# Patient Record
Sex: Male | Born: 1965 | Hispanic: Yes | Marital: Single
Health system: Southern US, Community
[De-identification: ages and names within clinical notes are randomized; demographics above are authoritative.]

---

## 2012-12-31 ENCOUNTER — Ambulatory Visit: Payer: Self-pay | Admitting: Family Medicine

## 2013-04-23 ENCOUNTER — Inpatient Hospital Stay: Payer: Self-pay | Admitting: Surgery

## 2013-04-23 LAB — CBC
HCT: 46.9 % (ref 40.0–52.0)
MCH: 31.1 pg (ref 26.0–34.0)
MCHC: 34.8 g/dL (ref 32.0–36.0)
MCV: 90 fL (ref 80–100)
RBC: 5.24 10*6/uL (ref 4.40–5.90)
RDW: 13.9 % (ref 11.5–14.5)
WBC: 7.1 10*3/uL (ref 3.8–10.6)

## 2013-04-23 LAB — COMPREHENSIVE METABOLIC PANEL
Albumin: 3.9 g/dL (ref 3.4–5.0)
Anion Gap: 3 — ABNORMAL LOW (ref 7–16)
Bilirubin,Total: 0.7 mg/dL (ref 0.2–1.0)
Co2: 27 mmol/L (ref 21–32)
Creatinine: 0.79 mg/dL (ref 0.60–1.30)
EGFR (African American): >60
EGFR (Non-African Amer.): >60
Glucose: 101 mg/dL — ABNORMAL HIGH (ref 65–99)
Osmolality: 272 (ref 275–301)
SGPT (ALT): 114 U/L — ABNORMAL HIGH (ref 12–78)
Sodium: 136 mmol/L (ref 136–145)
Total Protein: 8.2 g/dL (ref 6.4–8.2)

## 2013-04-23 LAB — CK TOTAL AND CKMB (NOT AT ARMC): CK, Total: 113 U/L (ref 35–232)

## 2013-04-23 LAB — LIPASE, BLOOD: Lipase: 138 U/L (ref 73–393)

## 2013-04-23 LAB — TROPONIN I: Troponin-I: <0.02 ng/mL

## 2013-04-24 LAB — CBC WITH DIFFERENTIAL/PLATELET
Basophil %: 0.1 %
Eosinophil #: 0 10*3/uL (ref 0.0–0.7)
Eosinophil %: 0 %
HGB: 15.5 g/dL (ref 13.0–18.0)
Lymphocyte #: 1.6 10*3/uL (ref 1.0–3.6)
Lymphocyte %: 13.8 %
MCH: 31.2 pg (ref 26.0–34.0)
MCV: 90 fL (ref 80–100)
Monocyte %: 4.1 %
Neutrophil #: 9.5 10*3/uL — ABNORMAL HIGH (ref 1.4–6.5)
Platelet: 252 10*3/uL (ref 150–440)
RDW: 14 % (ref 11.5–14.5)

## 2013-04-24 LAB — BASIC METABOLIC PANEL
Anion Gap: 5 — ABNORMAL LOW (ref 7–16)
BUN: 7 mg/dL (ref 7–18)
Calcium, Total: 8.7 mg/dL (ref 8.5–10.1)
Chloride: 104 mmol/L (ref 98–107)
Co2: 28 mmol/L (ref 21–32)
EGFR (African American): >60
Glucose: 116 mg/dL — ABNORMAL HIGH (ref 65–99)
Osmolality: 273 (ref 275–301)
Potassium: 3.6 mmol/L (ref 3.5–5.1)
Sodium: 137 mmol/L (ref 136–145)

## 2013-04-24 LAB — URINALYSIS, COMPLETE
Bacteria: NONE SEEN
Bilirubin,UR: NEGATIVE
Blood: NEGATIVE
Glucose,UR: NEGATIVE mg/dL (ref 0–75)
Ph: 6 (ref 4.5–8.0)

## 2013-04-25 LAB — CBC WITH DIFFERENTIAL/PLATELET
Basophil #: 0 10*3/uL (ref 0.0–0.1)
Basophil %: 0.1 %
Eosinophil %: 0.1 %
Lymphocyte #: 1.8 10*3/uL (ref 1.0–3.6)
Lymphocyte %: 19.9 %
MCHC: 35.1 g/dL (ref 32.0–36.0)
MCV: 89 fL (ref 80–100)
Platelet: 197 10*3/uL (ref 150–440)
RBC: 4.46 10*6/uL (ref 4.40–5.90)

## 2013-04-25 LAB — COMPREHENSIVE METABOLIC PANEL
Alkaline Phosphatase: 100 U/L (ref 50–136)
BUN: 5 mg/dL — ABNORMAL LOW (ref 7–18)
Calcium, Total: 8.5 mg/dL (ref 8.5–10.1)
Chloride: 106 mmol/L (ref 98–107)
Co2: 25 mmol/L (ref 21–32)
EGFR (African American): >60
Osmolality: 273 (ref 275–301)
Potassium: 3.6 mmol/L (ref 3.5–5.1)
SGOT(AST): 51 U/L — ABNORMAL HIGH (ref 15–37)
Sodium: 138 mmol/L (ref 136–145)

## 2013-04-26 LAB — CBC WITH DIFFERENTIAL/PLATELET
Basophil #: 0 10*3/uL (ref 0.0–0.1)
Eosinophil %: 0.6 %
HCT: 40.1 % (ref 40.0–52.0)
Lymphocyte #: 2.2 10*3/uL (ref 1.0–3.6)
Lymphocyte %: 27.4 %
MCH: 31.5 pg (ref 26.0–34.0)
MCHC: 35 g/dL (ref 32.0–36.0)
MCV: 90 fL (ref 80–100)
Monocyte #: 0.8 x10 3/mm (ref 0.2–1.0)
Monocyte %: 9.3 %
Neutrophil #: 5.1 10*3/uL (ref 1.4–6.5)
WBC: 8.1 10*3/uL (ref 3.8–10.6)

## 2013-04-26 LAB — COMPREHENSIVE METABOLIC PANEL
Albumin: 3.2 g/dL — ABNORMAL LOW (ref 3.4–5.0)
Alkaline Phosphatase: 89 U/L (ref 50–136)
Bilirubin,Total: 1.2 mg/dL — ABNORMAL HIGH (ref 0.2–1.0)
EGFR (African American): >60
EGFR (Non-African Amer.): >60
Glucose: 98 mg/dL (ref 65–99)
Osmolality: 274 (ref 275–301)
Potassium: 3.4 mmol/L — ABNORMAL LOW (ref 3.5–5.1)
SGOT(AST): 53 U/L — ABNORMAL HIGH (ref 15–37)

## 2013-05-08 ENCOUNTER — Other Ambulatory Visit: Payer: Self-pay | Admitting: Surgery

## 2013-05-08 LAB — URINALYSIS, COMPLETE
Bacteria: NONE SEEN
Blood: NEGATIVE
Ketone: NEGATIVE
Leukocyte Esterase: NEGATIVE
Nitrite: NEGATIVE
Ph: 5 (ref 4.5–8.0)
Protein: NEGATIVE
RBC,UR: 1 /HPF (ref 0–5)
Specific Gravity: 1.015 (ref 1.003–1.030)

## 2013-05-15 ENCOUNTER — Other Ambulatory Visit: Payer: Self-pay | Admitting: Surgery

## 2013-05-15 LAB — URINALYSIS, COMPLETE
Blood: NEGATIVE
Glucose,UR: NEGATIVE mg/dL (ref 0–75)
Nitrite: NEGATIVE
Protein: NEGATIVE
RBC,UR: NONE SEEN /HPF (ref 0–5)
Squamous Epithelial: NONE SEEN

## 2013-05-15 LAB — CBC WITH DIFFERENTIAL/PLATELET
Basophil #: 0 10*3/uL (ref 0.0–0.1)
Eosinophil %: 4.5 %
HCT: 42.1 % (ref 40.0–52.0)
MCHC: 34.1 g/dL (ref 32.0–36.0)
MCV: 91 fL (ref 80–100)
Monocyte #: 0.2 x10 3/mm (ref 0.2–1.0)
Monocyte %: 4.4 %
Neutrophil #: 2.3 10*3/uL (ref 1.4–6.5)
RBC: 4.62 10*6/uL (ref 4.40–5.90)
RDW: 14.2 % (ref 11.5–14.5)

## 2013-05-15 LAB — COMPREHENSIVE METABOLIC PANEL
Anion Gap: 4 — ABNORMAL LOW (ref 7–16)
Bilirubin,Total: 0.7 mg/dL (ref 0.2–1.0)
Calcium, Total: 9.9 mg/dL (ref 8.5–10.1)
Chloride: 105 mmol/L (ref 98–107)
Co2: 28 mmol/L (ref 21–32)
EGFR (Non-African Amer.): >60
Glucose: 114 mg/dL — ABNORMAL HIGH (ref 65–99)
Potassium: 4.5 mmol/L (ref 3.5–5.1)
Sodium: 137 mmol/L (ref 136–145)
Total Protein: 7.9 g/dL (ref 6.4–8.2)

## 2013-06-19 ENCOUNTER — Other Ambulatory Visit: Payer: Self-pay | Admitting: Surgery

## 2013-06-19 LAB — CBC WITH DIFFERENTIAL/PLATELET
Basophil %: 0.6 %
Eosinophil #: 0.1 10*3/uL (ref 0.0–0.7)
HGB: 14.9 g/dL (ref 13.0–18.0)
Lymphocyte #: 1.8 10*3/uL (ref 1.0–3.6)
Lymphocyte %: 29.8 %
MCH: 31.1 pg (ref 26.0–34.0)
MCHC: 34.4 g/dL (ref 32.0–36.0)
MCV: 90 fL (ref 80–100)
Monocyte #: 0.3 x10 3/mm (ref 0.2–1.0)
Platelet: 234 10*3/uL (ref 150–440)
RBC: 4.78 10*6/uL (ref 4.40–5.90)
RDW: 13.6 % (ref 11.5–14.5)
WBC: 6.2 10*3/uL (ref 3.8–10.6)

## 2013-06-19 LAB — COMPREHENSIVE METABOLIC PANEL
Albumin: 4.1 g/dL (ref 3.4–5.0)
BUN: 11 mg/dL (ref 7–18)
Bilirubin,Total: 0.7 mg/dL (ref 0.2–1.0)
EGFR (African American): >60
Osmolality: 280 (ref 275–301)
SGOT(AST): 49 U/L — ABNORMAL HIGH (ref 15–37)
SGPT (ALT): 61 U/L (ref 12–78)
Total Protein: 7.5 g/dL (ref 6.4–8.2)

## 2013-06-20 ENCOUNTER — Ambulatory Visit: Payer: Self-pay | Admitting: Surgery

## 2013-06-25 ENCOUNTER — Ambulatory Visit: Payer: Self-pay | Admitting: Gastroenterology

## 2013-10-13 ENCOUNTER — Emergency Department: Payer: Self-pay | Admitting: Emergency Medicine

## 2013-10-13 LAB — URINALYSIS, COMPLETE
BACTERIA: NEGATIVE
BLOOD: NEGATIVE
Bilirubin,UR: NEGATIVE
Glucose,UR: NEGATIVE mg/dL (ref 0–75)
Ketone: NEGATIVE
Leukocyte Esterase: NEGATIVE
Nitrite: NEGATIVE
Ph: 6 (ref 4.5–8.0)
Protein: NEGATIVE
Specific Gravity: 1.021 (ref 1.003–1.030)

## 2013-10-13 LAB — CBC WITH DIFFERENTIAL/PLATELET
BASOS ABS: 0 10*3/uL (ref 0.0–0.1)
Basophil %: 0.2 %
EOS ABS: 0 10*3/uL (ref 0.0–0.7)
Eosinophil %: 0.1 %
HCT: 48.2 % (ref 40.0–52.0)
HGB: 16.3 g/dL (ref 13.0–18.0)
LYMPHS PCT: 23 %
Lymphocyte #: 1.8 10*3/uL (ref 1.0–3.6)
MCH: 30.6 pg (ref 26.0–34.0)
MCHC: 33.8 g/dL (ref 32.0–36.0)
MCV: 91 fL (ref 80–100)
MONO ABS: 0.3 x10 3/mm (ref 0.2–1.0)
Monocyte %: 3.5 %
NEUTROS ABS: 5.7 10*3/uL (ref 1.4–6.5)
NEUTROS PCT: 73.2 %
PLATELETS: 221 10*3/uL (ref 150–440)
RBC: 5.33 10*6/uL (ref 4.40–5.90)
RDW: 14.1 % (ref 11.5–14.5)
WBC: 7.7 10*3/uL (ref 3.8–10.6)

## 2013-10-13 LAB — COMPREHENSIVE METABOLIC PANEL
AST: 94 U/L — AB (ref 15–37)
Albumin: 3.8 g/dL (ref 3.4–5.0)
Alkaline Phosphatase: 113 U/L
Anion Gap: 4 — ABNORMAL LOW (ref 7–16)
BILIRUBIN TOTAL: 0.7 mg/dL (ref 0.2–1.0)
BUN: 16 mg/dL (ref 7–18)
CREATININE: 0.88 mg/dL (ref 0.60–1.30)
Calcium, Total: 9.3 mg/dL (ref 8.5–10.1)
Chloride: 108 mmol/L — ABNORMAL HIGH (ref 98–107)
Co2: 25 mmol/L (ref 21–32)
EGFR (African American): >60
GLUCOSE: 97 mg/dL (ref 65–99)
Osmolality: 275 (ref 275–301)
Potassium: 4 mmol/L (ref 3.5–5.1)
SGPT (ALT): 125 U/L — ABNORMAL HIGH (ref 12–78)
Sodium: 137 mmol/L (ref 136–145)
Total Protein: 8.6 g/dL — ABNORMAL HIGH (ref 6.4–8.2)

## 2014-02-12 ENCOUNTER — Emergency Department: Payer: Self-pay | Admitting: Emergency Medicine

## 2014-02-12 LAB — COMPREHENSIVE METABOLIC PANEL
ANION GAP: 6 — AB (ref 7–16)
Albumin: 3.9 g/dL (ref 3.4–5.0)
Alkaline Phosphatase: 92 U/L
BUN: 9 mg/dL (ref 7–18)
Bilirubin,Total: 0.8 mg/dL (ref 0.2–1.0)
Calcium, Total: 9.1 mg/dL (ref 8.5–10.1)
Chloride: 107 mmol/L (ref 98–107)
Co2: 28 mmol/L (ref 21–32)
Creatinine: 0.72 mg/dL (ref 0.60–1.30)
EGFR (African American): >60
Glucose: 95 mg/dL (ref 65–99)
OSMOLALITY: 280 (ref 275–301)
POTASSIUM: 3.6 mmol/L (ref 3.5–5.1)
SGOT(AST): 38 U/L — ABNORMAL HIGH (ref 15–37)
SGPT (ALT): 62 U/L (ref 12–78)
Sodium: 141 mmol/L (ref 136–145)
Total Protein: 7.8 g/dL (ref 6.4–8.2)

## 2014-02-12 LAB — URINALYSIS, COMPLETE
Bacteria: NONE SEEN
Bilirubin,UR: NEGATIVE
Blood: NEGATIVE
GLUCOSE, UR: NEGATIVE mg/dL (ref 0–75)
Ketone: NEGATIVE
Leukocyte Esterase: NEGATIVE
Nitrite: NEGATIVE
Ph: 5 (ref 4.5–8.0)
Protein: NEGATIVE
RBC,UR: <1 /HPF (ref 0–5)
SPECIFIC GRAVITY: 1.014 (ref 1.003–1.030)
SQUAMOUS EPITHELIAL: NONE SEEN
WBC UR: 1 /HPF (ref 0–5)

## 2014-02-12 LAB — CBC WITH DIFFERENTIAL/PLATELET
BASOS PCT: 0.6 %
Basophil #: 0 10*3/uL (ref 0.0–0.1)
Eosinophil #: 0.1 10*3/uL (ref 0.0–0.7)
Eosinophil %: 1.8 %
HCT: 47.4 % (ref 40.0–52.0)
HGB: 15.6 g/dL (ref 13.0–18.0)
LYMPHS PCT: 33.6 %
Lymphocyte #: 2.5 10*3/uL (ref 1.0–3.6)
MCH: 30.2 pg (ref 26.0–34.0)
MCHC: 32.9 g/dL (ref 32.0–36.0)
MCV: 92 fL (ref 80–100)
MONO ABS: 0.4 x10 3/mm (ref 0.2–1.0)
MONOS PCT: 5.6 %
NEUTROS PCT: 58.4 %
Neutrophil #: 4.3 10*3/uL (ref 1.4–6.5)
PLATELETS: 279 10*3/uL (ref 150–440)
RBC: 5.15 10*6/uL (ref 4.40–5.90)
RDW: 14.1 % (ref 11.5–14.5)
WBC: 7.4 10*3/uL (ref 3.8–10.6)

## 2014-02-12 LAB — TROPONIN I: Troponin-I: <0.02 ng/mL

## 2014-02-12 LAB — LIPASE, BLOOD: Lipase: 208 U/L (ref 73–393)

## 2014-02-28 ENCOUNTER — Emergency Department: Payer: Self-pay | Admitting: Internal Medicine

## 2014-02-28 LAB — COMPREHENSIVE METABOLIC PANEL
AST: 82 U/L — AB (ref 15–37)
Albumin: 4 g/dL (ref 3.4–5.0)
Alkaline Phosphatase: 96 U/L
Anion Gap: 4 — ABNORMAL LOW (ref 7–16)
BILIRUBIN TOTAL: 1 mg/dL (ref 0.2–1.0)
BUN: 16 mg/dL (ref 7–18)
CO2: 25 mmol/L (ref 21–32)
CREATININE: 0.83 mg/dL (ref 0.60–1.30)
Calcium, Total: 9.5 mg/dL (ref 8.5–10.1)
Chloride: 106 mmol/L (ref 98–107)
EGFR (African American): >60
EGFR (Non-African Amer.): >60
Glucose: 124 mg/dL — ABNORMAL HIGH (ref 65–99)
Osmolality: 273 (ref 275–301)
POTASSIUM: 4.2 mmol/L (ref 3.5–5.1)
SGPT (ALT): 112 U/L — ABNORMAL HIGH (ref 12–78)
Sodium: 135 mmol/L — ABNORMAL LOW (ref 136–145)
Total Protein: 8.2 g/dL (ref 6.4–8.2)

## 2014-02-28 LAB — LIPASE, BLOOD: LIPASE: 108 U/L (ref 73–393)

## 2014-02-28 LAB — CBC WITH DIFFERENTIAL/PLATELET
BASOS PCT: 0.3 %
Basophil #: 0 10*3/uL (ref 0.0–0.1)
EOS PCT: 0.9 %
Eosinophil #: 0 10*3/uL (ref 0.0–0.7)
HCT: 46.2 % (ref 40.0–52.0)
HGB: 15.6 g/dL (ref 13.0–18.0)
Lymphocyte #: 0.7 10*3/uL — ABNORMAL LOW (ref 1.0–3.6)
Lymphocyte %: 14 %
MCH: 30.9 pg (ref 26.0–34.0)
MCHC: 33.7 g/dL (ref 32.0–36.0)
MCV: 92 fL (ref 80–100)
MONOS PCT: 3.2 %
Monocyte #: 0.2 x10 3/mm (ref 0.2–1.0)
NEUTROS PCT: 81.6 %
Neutrophil #: 4 10*3/uL (ref 1.4–6.5)
Platelet: 160 10*3/uL (ref 150–440)
RBC: 5.05 10*6/uL (ref 4.40–5.90)
RDW: 13.7 % (ref 11.5–14.5)
WBC: 4.9 10*3/uL (ref 3.8–10.6)

## 2014-04-27 IMAGING — CT CT ABD-PELV W/ CM
1 of 2 series · 15 of 32 positions shown, 19 images · non-contrast
Comparison: none

REASON FOR EXAM: (1) RUQ epigastric pain; (2) RUQ epigastric pain
COMMENTS:

[Series 2: soft tissue · axial · 0.69mm/px · z∈[-668,-230]mm · 15 of 160 slices shown, 19 images]
[im 7/160  soft-tissue]
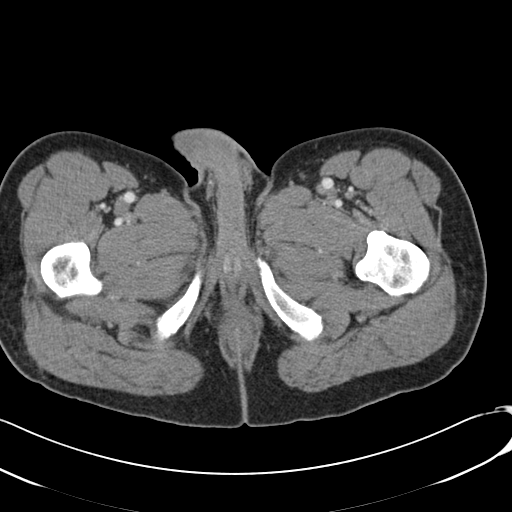
[im 7/160  bone]
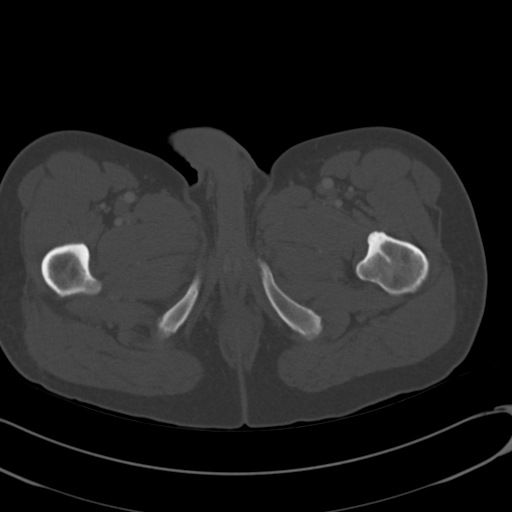
[im 20/160  soft-tissue]
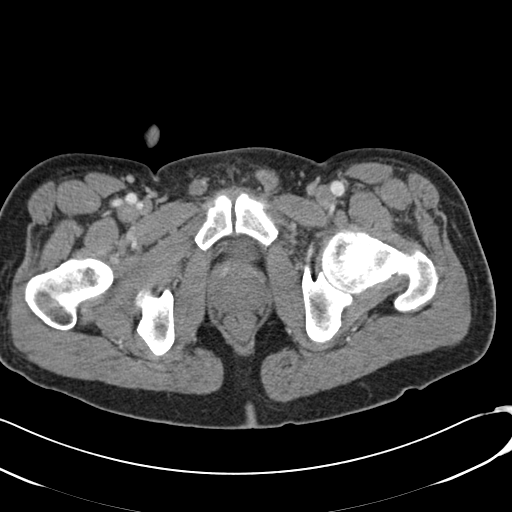
[im 34/160  soft-tissue]
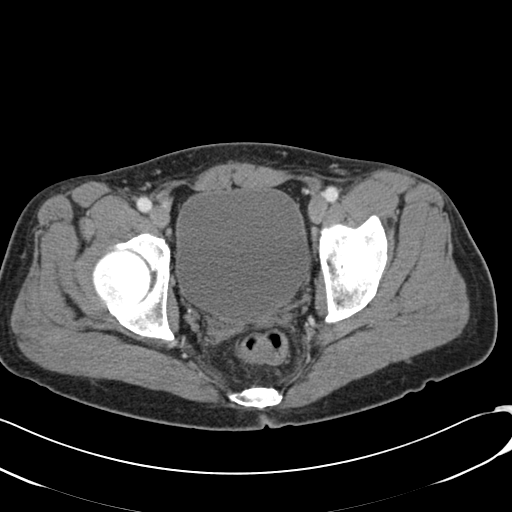
[im 47/160  soft-tissue]
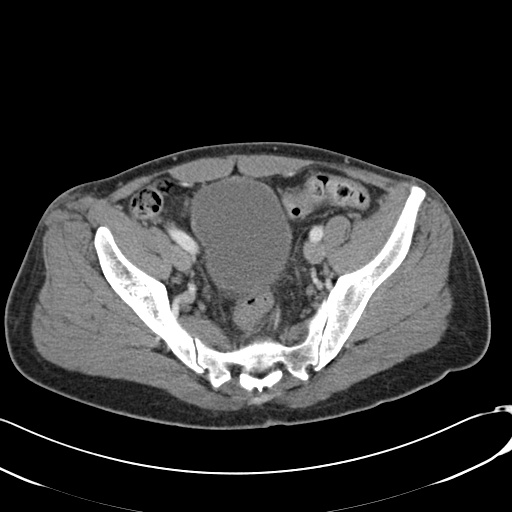
[im 54/160  soft-tissue]
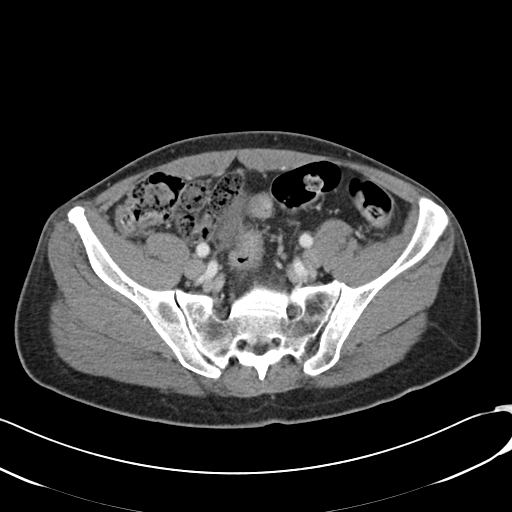
[im 67/160  soft-tissue]
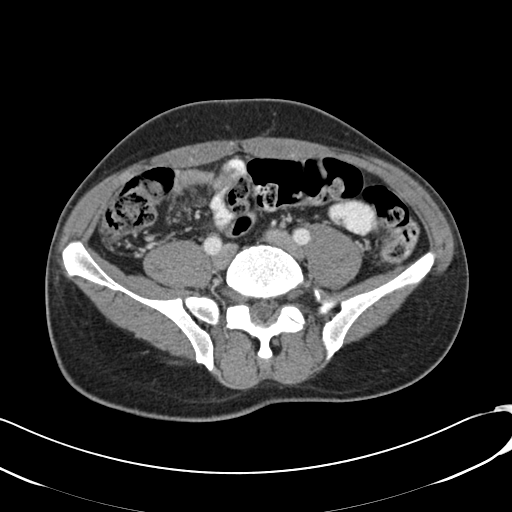
[im 80/160  soft-tissue]
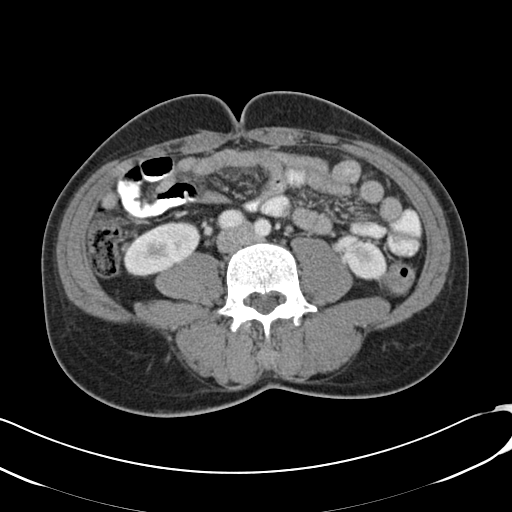
[im 93/160  soft-tissue]
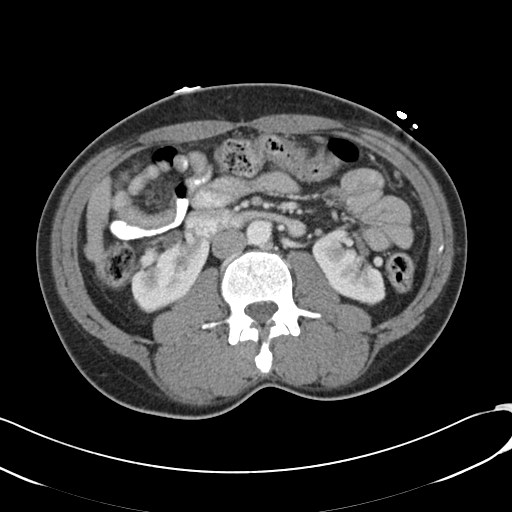
[im 107/160  soft-tissue]
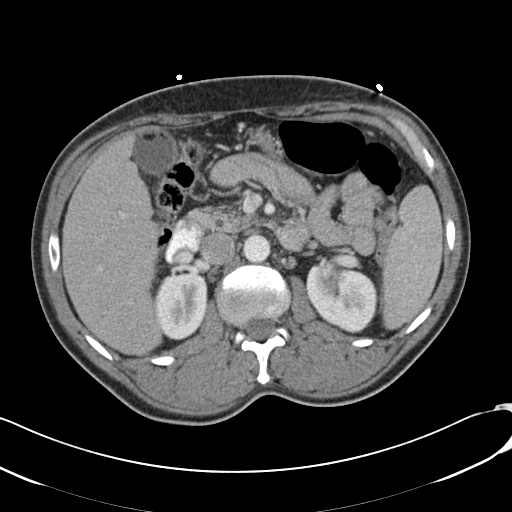
[im 107/160  bone]
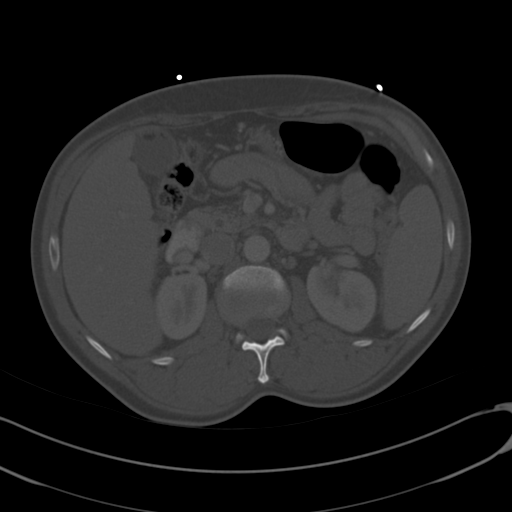
[im 113/160  soft-tissue]
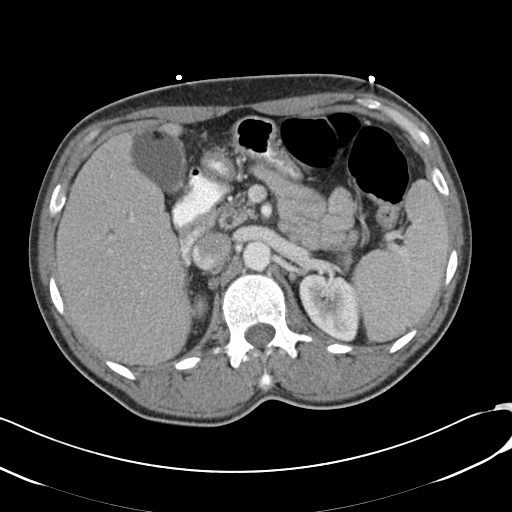
[im 126/160  soft-tissue]
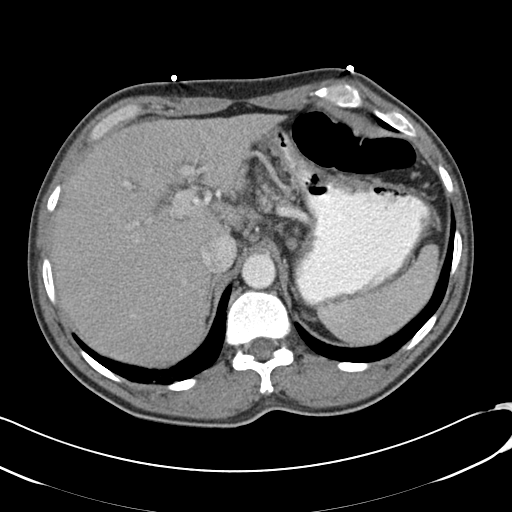
[im 133/160  lung]
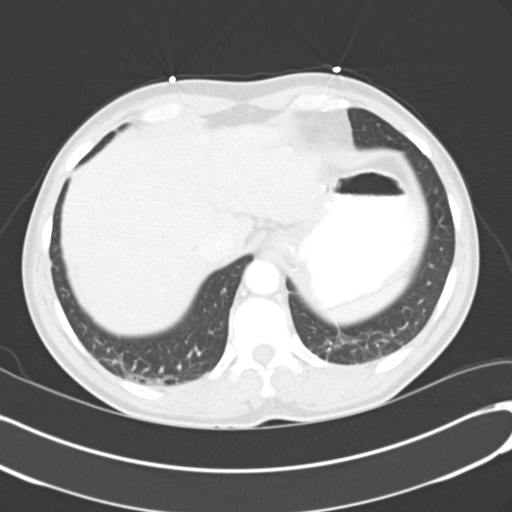
[im 140/160  soft-tissue]
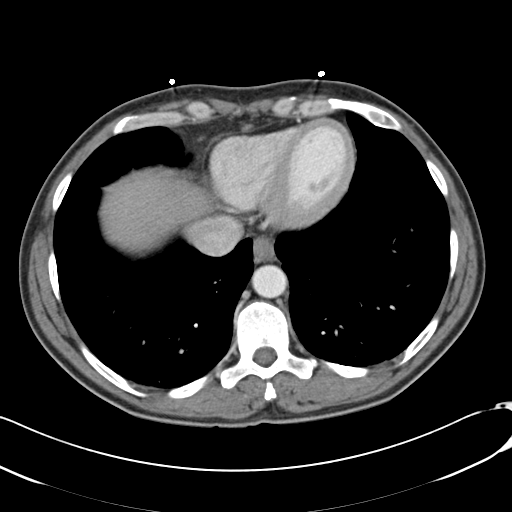
[im 140/160  lung]
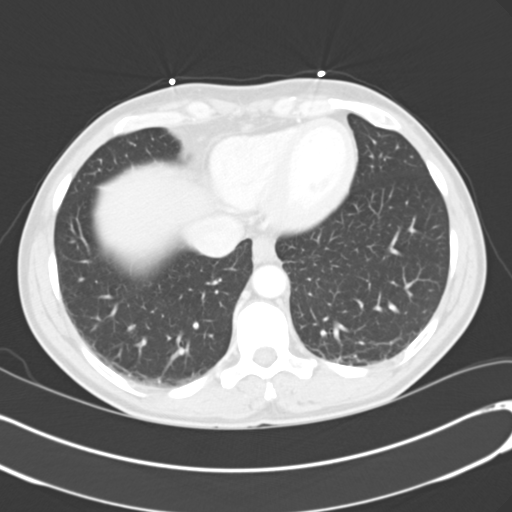
[im 146/160  lung]
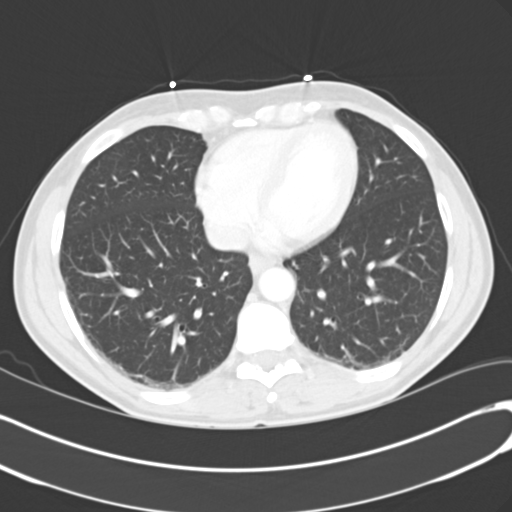
[im 153/160  soft-tissue]
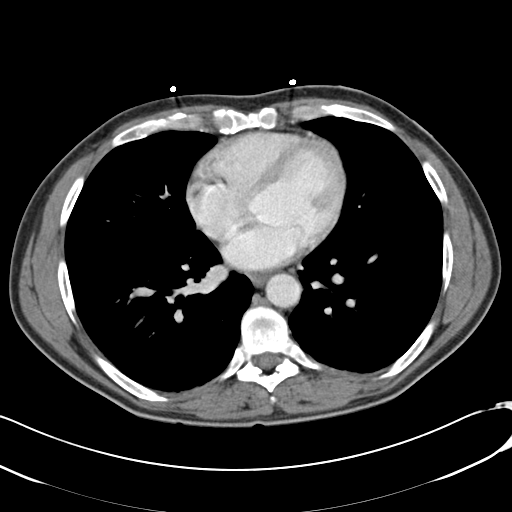
[im 153/160  lung]
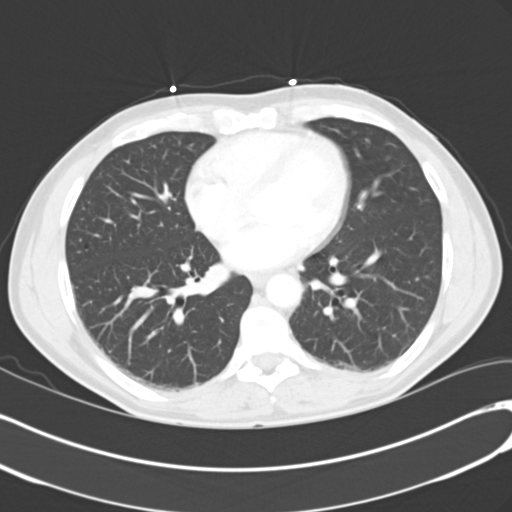

[15 of 32 positions shown; findings below may reference images not displayed]

PROCEDURE:     CT  - CT ABDOMEN / PELVIS  W  - April 23, 2013  [DATE]

RESULT:     CT of the abdomen and pelvis is performed with 100 mL of
Csovue-UEE iodinated intravenous contrast and oral contrast with images
reconstructed at 3 mm slice thickness in the axial plane. Patient has no
previous exam for comparison.

The appendix appears to be retrocecal and is normal in appearance. The
patient demonstrates some low-attenuation areas in the gallbladder fundus
suggestive of cholelithiasis. Small areas of low-attenuation are noted in
the right lobe of the liver suggestive of cysts. The common bile duct
diameter appears normal in the pancreatic head. The liver and spleen
otherwise appear unremarkable. The adrenal glands, abdominal aorta, kidneys,
stomach, small bowel and colon otherwise appear grossly normal. There is no
evidence of ascites or abnormal fluid collection. There is a moderate amount
of urine in the urinary bladder. No adenopathy is evident. The bony
structures appear within normal limits. The lung bases appear clear. There
is some minimal dependent atelectasis. The pancreas shows no ductal dilation
or mass.
IMPRESSION: 1. Cholelithiasis. No definite CT evidence of acute cholecystitis but
ultrasound or hepatobiliary nuclear medicine scan would be more sensitive.
2. Probable small hepatic cy[REDACTED]

## 2014-05-06 ENCOUNTER — Ambulatory Visit: Payer: Self-pay | Admitting: Internal Medicine

## 2014-05-23 ENCOUNTER — Inpatient Hospital Stay: Payer: Self-pay | Admitting: Internal Medicine

## 2014-05-23 LAB — DIFFERENTIAL
Basophil #: 0 10*3/uL (ref 0.0–0.1)
Basophil %: 0.6 %
Eosinophil #: 0.2 10*3/uL (ref 0.0–0.7)
Eosinophil %: 13.1 %
Lymphocyte #: 0.3 10*3/uL — ABNORMAL LOW (ref 1.0–3.6)
Lymphocyte %: 18.2 %
MONOS PCT: 6.1 %
Monocyte #: 0.1 x10 3/mm — ABNORMAL LOW (ref 0.2–1.0)
NEUTROS PCT: 62 %
Neutrophil #: 1.1 10*3/uL — ABNORMAL LOW (ref 1.4–6.5)

## 2014-05-23 LAB — CBC
HCT: 42.4 % (ref 40.0–52.0)
HGB: 14.2 g/dL (ref 13.0–18.0)
MCH: 30.2 pg (ref 26.0–34.0)
MCHC: 33.5 g/dL (ref 32.0–36.0)
MCV: 90 fL (ref 80–100)
PLATELETS: 103 10*3/uL — AB (ref 150–440)
RBC: 4.7 10*6/uL (ref 4.40–5.90)
RDW: 13.8 % (ref 11.5–14.5)
WBC: 1.7 10*3/uL — CL (ref 3.8–10.6)

## 2014-05-23 LAB — URINALYSIS, COMPLETE
BACTERIA: NONE SEEN
BILIRUBIN, UR: NEGATIVE
GLUCOSE, UR: NEGATIVE mg/dL (ref 0–75)
LEUKOCYTE ESTERASE: NEGATIVE
Nitrite: NEGATIVE
Ph: 5 (ref 4.5–8.0)
Protein: >=500
RBC, UR: NONE SEEN /HPF (ref 0–5)
Specific Gravity: 1.026 (ref 1.003–1.030)
Squamous Epithelial: NONE SEEN
WBC UR: 1 /HPF (ref 0–5)

## 2014-05-23 LAB — COMPREHENSIVE METABOLIC PANEL
ALBUMIN: 3.4 g/dL (ref 3.4–5.0)
ANION GAP: 9 (ref 7–16)
AST: 84 U/L — AB (ref 15–37)
Alkaline Phosphatase: 76 U/L
BUN: 15 mg/dL (ref 7–18)
Bilirubin,Total: 0.5 mg/dL (ref 0.2–1.0)
CALCIUM: 8.4 mg/dL — AB (ref 8.5–10.1)
CHLORIDE: 106 mmol/L (ref 98–107)
Co2: 21 mmol/L (ref 21–32)
Creatinine: 1.17 mg/dL (ref 0.60–1.30)
EGFR (African American): >60
EGFR (Non-African Amer.): >60
GLUCOSE: 108 mg/dL — AB (ref 65–99)
Osmolality: 273 (ref 275–301)
POTASSIUM: 4.2 mmol/L (ref 3.5–5.1)
SGPT (ALT): 78 U/L — ABNORMAL HIGH
SODIUM: 136 mmol/L (ref 136–145)
Total Protein: 7.5 g/dL (ref 6.4–8.2)

## 2014-05-23 LAB — DRUG SCREEN, URINE
AMPHETAMINES, UR SCREEN: NEGATIVE (ref ?–1000)
BENZODIAZEPINE, UR SCRN: POSITIVE (ref ?–200)
Barbiturates, Ur Screen: NEGATIVE (ref ?–200)
CANNABINOID 50 NG, UR ~~LOC~~: NEGATIVE (ref ?–50)
Cocaine Metabolite,Ur ~~LOC~~: NEGATIVE (ref ?–300)
MDMA (ECSTASY) UR SCREEN: NEGATIVE (ref ?–500)
Methadone, Ur Screen: POSITIVE (ref ?–300)
Opiate, Ur Screen: NEGATIVE (ref ?–300)
PHENCYCLIDINE (PCP) UR S: NEGATIVE (ref ?–25)
Tricyclic, Ur Screen: NEGATIVE (ref ?–1000)

## 2014-05-23 LAB — RAPID HIV SCREEN (HIV 1/2 AB+AG)

## 2014-05-24 LAB — CBC WITH DIFFERENTIAL/PLATELET
Bands: 5 %
Comment - H1-Com1: NORMAL
EOS PCT: 5 %
HCT: 40 % (ref 40.0–52.0)
HGB: 13.5 g/dL (ref 13.0–18.0)
LYMPHS PCT: 45 %
MCH: 30.9 pg (ref 26.0–34.0)
MCHC: 33.7 g/dL (ref 32.0–36.0)
MCV: 92 fL (ref 80–100)
MONOS PCT: 7 %
Platelet: 92 10*3/uL — ABNORMAL LOW (ref 150–440)
RBC: 4.36 10*6/uL — ABNORMAL LOW (ref 4.40–5.90)
RDW: 13.7 % (ref 11.5–14.5)
Segmented Neutrophils: 34 %
Variant Lymphocyte - H1-Rlymph: 4 %
WBC: 1.4 10*3/uL — AB (ref 3.8–10.6)

## 2014-05-24 LAB — BASIC METABOLIC PANEL
ANION GAP: 2 — AB (ref 7–16)
BUN: 12 mg/dL (ref 7–18)
Calcium, Total: 7.3 mg/dL — ABNORMAL LOW (ref 8.5–10.1)
Chloride: 111 mmol/L — ABNORMAL HIGH (ref 98–107)
Co2: 24 mmol/L (ref 21–32)
Creatinine: 1 mg/dL (ref 0.60–1.30)
EGFR (African American): >60
EGFR (Non-African Amer.): >60
Glucose: 98 mg/dL (ref 65–99)
Osmolality: 274 (ref 275–301)
Potassium: 4 mmol/L (ref 3.5–5.1)
Sodium: 137 mmol/L (ref 136–145)

## 2014-05-25 LAB — CBC WITH DIFFERENTIAL/PLATELET
Basophil #: 0 10*3/uL (ref 0.0–0.1)
Basophil %: 0.5 %
EOS ABS: 0.1 10*3/uL (ref 0.0–0.7)
Eosinophil %: 3.1 %
HCT: 40.5 % (ref 40.0–52.0)
HGB: 13.6 g/dL (ref 13.0–18.0)
LYMPHS ABS: 1.1 10*3/uL (ref 1.0–3.6)
LYMPHS PCT: 51 %
MCH: 30.5 pg (ref 26.0–34.0)
MCHC: 33.6 g/dL (ref 32.0–36.0)
MCV: 91 fL (ref 80–100)
MONO ABS: 0.2 x10 3/mm (ref 0.2–1.0)
MONOS PCT: 8 %
NEUTROS PCT: 37.4 %
Neutrophil #: 0.8 10*3/uL — ABNORMAL LOW (ref 1.4–6.5)
PLATELETS: 99 10*3/uL — AB (ref 150–440)
RBC: 4.46 10*6/uL (ref 4.40–5.90)
RDW: 13.9 % (ref 11.5–14.5)
WBC: 2.1 10*3/uL — ABNORMAL LOW (ref 3.8–10.6)

## 2014-05-25 LAB — FOLATE: Folic Acid: 15.3 ng/mL (ref 3.1–100.0)

## 2014-05-25 LAB — PROTIME-INR
INR: 1.1
Prothrombin Time: 14.5 secs (ref 11.5–14.7)

## 2014-05-25 LAB — IRON AND TIBC
Iron Bind.Cap.(Total): 247 ug/dL — ABNORMAL LOW (ref 250–450)
Iron Saturation: 55 %
Iron: 136 ug/dL (ref 65–175)
UNBOUND IRON-BIND. CAP.: 111 ug/dL

## 2014-05-25 LAB — LACTATE DEHYDROGENASE: LDH: 225 U/L (ref 85–241)

## 2014-05-25 LAB — APTT: Activated PTT: 39.4 secs — ABNORMAL HIGH (ref 23.6–35.9)

## 2014-05-25 LAB — FIBRINOGEN: FIBRINOGEN: 178 mg/dL — AB (ref 210–470)

## 2014-05-25 LAB — URINE CULTURE

## 2014-05-25 LAB — FERRITIN: Ferritin (ARMC): 303 ng/mL (ref 8–388)

## 2014-05-26 LAB — CBC WITH DIFFERENTIAL/PLATELET
Basophil #: 0 10*3/uL (ref 0.0–0.1)
Basophil %: 0.5 %
EOS PCT: 0.4 %
Eosinophil #: 0 10*3/uL (ref 0.0–0.7)
HCT: 43.2 % (ref 40.0–52.0)
HGB: 14 g/dL (ref 13.0–18.0)
Lymphocyte #: 1.3 10*3/uL (ref 1.0–3.6)
Lymphocyte %: 51.2 %
MCH: 29.6 pg (ref 26.0–34.0)
MCHC: 32.5 g/dL (ref 32.0–36.0)
MCV: 91 fL (ref 80–100)
MONO ABS: 0.2 x10 3/mm (ref 0.2–1.0)
MONOS PCT: 8.5 %
NEUTROS PCT: 39.4 %
Neutrophil #: 1 10*3/uL — ABNORMAL LOW (ref 1.4–6.5)
PLATELETS: 129 10*3/uL — AB (ref 150–440)
RBC: 4.74 10*6/uL (ref 4.40–5.90)
RDW: 13.5 % (ref 11.5–14.5)
WBC: 2.5 10*3/uL — ABNORMAL LOW (ref 3.8–10.6)

## 2014-05-26 LAB — VANCOMYCIN, TROUGH: Vancomycin, Trough: 7 ug/mL — ABNORMAL LOW (ref 10–20)

## 2014-05-27 ENCOUNTER — Ambulatory Visit: Payer: Self-pay | Admitting: Internal Medicine

## 2014-05-28 LAB — CULTURE, BLOOD (SINGLE)

## 2014-05-29 LAB — PROT IMMUNOELECTROPHORES(ARMC)

## 2014-06-05 ENCOUNTER — Ambulatory Visit: Payer: Self-pay | Admitting: Internal Medicine

## 2014-06-13 ENCOUNTER — Ambulatory Visit: Payer: Self-pay | Admitting: Internal Medicine

## 2014-06-13 LAB — CBC CANCER CENTER
BASOS ABS: 0.2 x10 3/mm — AB (ref 0.0–0.1)
BASOS PCT: 3.6 %
Eosinophil #: 0.2 x10 3/mm (ref 0.0–0.7)
Eosinophil %: 2.6 %
HCT: 43.6 % (ref 40.0–52.0)
HGB: 14.4 g/dL (ref 13.0–18.0)
Lymphocyte #: 1.7 x10 3/mm (ref 1.0–3.6)
Lymphocyte %: 28.7 %
MCH: 30.5 pg (ref 26.0–34.0)
MCHC: 32.9 g/dL (ref 32.0–36.0)
MCV: 93 fL (ref 80–100)
MONO ABS: 0.4 x10 3/mm (ref 0.2–1.0)
MONOS PCT: 6 %
Neutrophil #: 3.5 x10 3/mm (ref 1.4–6.5)
Neutrophil %: 59.1 %
PLATELETS: 202 x10 3/mm (ref 150–440)
RBC: 4.71 10*6/uL (ref 4.40–5.90)
RDW: 13.8 % (ref 11.5–14.5)
WBC: 5.9 x10 3/mm (ref 3.8–10.6)

## 2014-06-19 ENCOUNTER — Ambulatory Visit: Payer: Self-pay | Admitting: Urgent Care

## 2014-06-19 LAB — COMPREHENSIVE METABOLIC PANEL
ALT: 94 U/L — AB
ANION GAP: 7 (ref 7–16)
Albumin: 3.8 g/dL (ref 3.4–5.0)
Alkaline Phosphatase: 108 U/L
BUN: 14 mg/dL (ref 7–18)
Bilirubin,Total: 0.8 mg/dL (ref 0.2–1.0)
Calcium, Total: 8.5 mg/dL (ref 8.5–10.1)
Chloride: 107 mmol/L (ref 98–107)
Co2: 25 mmol/L (ref 21–32)
Creatinine: 0.82 mg/dL (ref 0.60–1.30)
EGFR (Non-African Amer.): >60
GLUCOSE: 110 mg/dL — AB (ref 65–99)
OSMOLALITY: 279 (ref 275–301)
Potassium: 3.9 mmol/L (ref 3.5–5.1)
SGOT(AST): 62 U/L — ABNORMAL HIGH (ref 15–37)
SODIUM: 139 mmol/L (ref 136–145)
TOTAL PROTEIN: 7.9 g/dL (ref 6.4–8.2)

## 2014-06-19 LAB — CBC WITH DIFFERENTIAL/PLATELET
BASOS PCT: 1 %
Basophil #: 0 10*3/uL (ref 0.0–0.1)
Eosinophil #: 0.2 10*3/uL (ref 0.0–0.7)
Eosinophil %: 4.6 %
HCT: 45.6 % (ref 40.0–52.0)
HGB: 14.7 g/dL (ref 13.0–18.0)
Lymphocyte #: 0.9 10*3/uL — ABNORMAL LOW (ref 1.0–3.6)
Lymphocyte %: 25.7 %
MCH: 29.9 pg (ref 26.0–34.0)
MCHC: 32.3 g/dL (ref 32.0–36.0)
MCV: 93 fL (ref 80–100)
Monocyte #: 0.3 x10 3/mm (ref 0.2–1.0)
Monocyte %: 9.3 %
NEUTROS ABS: 2.2 10*3/uL (ref 1.4–6.5)
Neutrophil %: 59.4 %
Platelet: 158 10*3/uL (ref 150–440)
RBC: 4.92 10*6/uL (ref 4.40–5.90)
RDW: 14.2 % (ref 11.5–14.5)
WBC: 3.7 10*3/uL — ABNORMAL LOW (ref 3.8–10.6)

## 2014-06-19 LAB — PROTIME-INR
INR: 1.1
PROTHROMBIN TIME: 14.1 s (ref 11.5–14.7)

## 2014-06-20 LAB — AFP TUMOR MARKER: AFP-Tumor Marker: 1.7 ng/mL (ref 0.0–8.3)

## 2014-06-23 ENCOUNTER — Ambulatory Visit: Payer: Self-pay | Admitting: Urgent Care

## 2014-06-30 ENCOUNTER — Ambulatory Visit: Payer: Self-pay | Admitting: Urgent Care

## 2014-06-30 LAB — IRON AND TIBC
IRON SATURATION: 35 %
Iron Bind.Cap.(Total): 378 ug/dL (ref 250–450)
UNBOUND IRON-BIND. CAP.: 245 ug/dL

## 2014-06-30 LAB — FERRITIN: Ferritin (ARMC): 109 ng/mL (ref 8–388)

## 2014-06-30 LAB — IRON: Iron: 133 ug/dL (ref 65–175)

## 2014-07-06 ENCOUNTER — Ambulatory Visit: Payer: Self-pay | Admitting: Internal Medicine

## 2014-12-26 NOTE — H&P (Signed)
PATIENT NAME:  Shawn BullionMORALES, Alger MR#:  [id number] DATE OF BIRTH:  [birth year]  DATE OF ADMISSION:  04/23/2013  PRIMARY CARE PHYSICIAN: Dr. Lahoma RockerPancaldo ADMITTING PHYSICIAN: Dr. Michela PitcherEly.   CHIEF COMPLAINT: Abdominal pain, nausea and vomiting.   BRIEF HISTORY: Shawn Alexander is a 49 year old gentleman seen in the Emergency Room with a one-day history of significant midepigastric right upper quadrant abdominal pain associated with significant nausea and vomiting. He has had multiple previous episodes of a less severe nature, but this one began as the same sort of problem, but  after he ate, he developed pronounced increase in his abdominal discomfort associated with profound nausea and vomiting. He denies any similar complain of this serious nature. He presented to the Emergency Room for further evaluation. Work-up revealed normal white blood cell count and slightly elevated liver function studies with an alkaline phosphatase 142 and SGPT of 114 SGOT of 76. Lipase was not elevated. Ultrasound and CT scan were performed which identified what appeared to be an acute cholecystitis. The surgical service was consulted.   He has history of hepatitis C, previously treated with an unknown medication. He denies history of hepatitis, yellow jaundice, previous diagnosis of gallbladder disease, peptic ulcer or diverticulitis. He has had no previous abdominal surgery.  He has a long standing history of chronic pain medicine used for back pain is currently on Suboxone 2 mg sublingual film b.i.d. Apparently, he was followed in the pain medicine clinic, is now  followed by Dr. Lahoma RockerPancaldo with this problem. Denies any cardiac disease, hypertension or diabetes. He has no medical allergies.   SOCIAL HISTORY: He is not a cigarette smoker. Drinks alcohol only occasionally.   REVIEW OF SYSTEMS: Otherwise unremarkable.   FAMILY HISTORY: Noncontributory.   PHYSICAL EXAMINATION: GENERAL: He is an alert, cooperative, comfortable gentleman,  sleeping with his previous pain medicine. He is afebrile.   VITAL SIGNS: His blood pressure is 130/78. Heart rate 72 and regular, he is afebrile.  HEENT: No scleral icterus. No pupillary abnormalities. No facial deformities. He does appear to be disheveled and unkempt.   NECK: Supple, nontender with no adenopathy. Midline trachea.  CHEST: Clear with normal pulmonary excursion. No adventitious sounds are noted.  CARDIAC: No murmurs or gallops to my ear. He seems to be in normal sinus rhythm.  ABDOMEN: His abdomen is generally soft. He does have some marked right upper quadrant midepigastric pain, particularly deep palpation. He does have some mild guarding. There is no rebound noted.  LOWER EXTREMITIES: Reveals full range of motion. No deformities. Good distal pulses.  PSYCHIATRIC: Normal orientation. A fairly flat affect.   ASSESSMENT AND PLAN: I have independently reviewed his CT scan. He does have changes consistent with possible acute cholecystitis. I discussed the situation with the patient in detail. We will plan to admit him tonight, start him on antibiotic therapy and tentatively plan surgery for tomorrow morning. This plan has been outlined to him in detail and he is in agreement. Risks, benefits and options have been outlined and accepted.   ____________________________ Carmie Endalph L. Ely III, MD rle:cc D: 04/23/2013 21:51:11 ET T: 04/23/2013 22:24:03 ET JOB#: [id number]  cc: Quentin Orealph L. Ely III, MD, <Dictator> Nira ConnAriana Pancaldo, MD Quentin OreALPH L ELY MD ELECTRONICALLY SIGNED 04/25/2013 6:03

## 2014-12-26 NOTE — Op Note (Signed)
PATIENT NAME:  Shawn Alexander, Shawn Alexander MR#:  [id number] DATE OF BIRTH:  14-Sep-1965  DATE OF PROCEDURE:  04/24/2013  PREOPERATIVE DIAGNOSIS: Acute cholecystitis.   POSTOPERATIVE DIAGNOSIS: Acute cholecystitis with cirrhosis.   PROCEDURE:  Laparoscopic cholecystectomy with cholangiography.   SURGEON: Oluwatosin Bracy E. Excell Seltzerooper, M.D.   ANESTHESIA: General with endotracheal tube.   INDICATIONS: This is a patient with unrelenting right upper quadrant pain associated with fatty food intolerance, and a workup showing acute cholecystitis. Preoperatively, we discussed the rationale for surgery, the options of observation, risks of bleeding, infection, recurrence of symptoms, failure to resolve his symptoms, open procedure, bile duct damage, bile duct leak, retained common bile duct stone, any of which could require further surgery and/or ERCP, stent  and papillotomy. This was all reviewed for him. He understood and agreed to proceed.   FINDINGS: Grossly cirrhotic liver, multiple gallstones, small gallstones within an enlarged cystic duct.   C-arm fluoroscopic cholangiography demonstrated good flow in the duodenum without intraluminal filling defects. Proximal ducts were well identified, and the cystic duct had been cannulated.   DESCRIPTION OF PROCEDURE: The patient was induced with general anesthesia. He was on IV antibiotics. He was prepped and draped in a sterile fashion. Marcaine was infiltrated in the skin and subcutaneous tissues and around where the periumbilical incision was made. Veress needle was placed. Pneumoperitoneum was obtained, and a 5 mm trocar port was placed. The abdominal cavity was explored, and under direct vision a 10 mm epigastric port and 2 lateral 5 mm ports were placed. The greatly edematous gallbladder was placed on tension. The peritoneum over the infundibulum was incised bluntly. The cystic artery was well identified, doubly clipped and divided. This allowed for good visualization of an  enlarged cystic duct entering the infundibulum. Here, it was clipped and incised and through a separate incision, an Angiocath cholangiogram catheter was placed, and C-arm fluoroscopic cholangiography demonstrated the above. The catheter was removed. The cystic duct was doubly clipped and divided, and the gallbladder was taken from the gallbladder fossa with electrocautery and passed out through the epigastric port site with the aid of an Endo Catch bag. The epigastric port site required enlargement due to the multiple stones and edematous gallbladder.   Attention was returned to the gallbladder fossa where extensive bleeding was identified from multiple sites. Electrocautery was utilized. Hemostasis was controlled, and 3 pieces of Surgicel were placed into the fossa. No further bleeding was noted. It was checked multiple times.   A 10 mm JP drain was placed in the foramen of Winslow, brought out through a lateral port site and held in with 3-0 nylon. Again, hemostasis was checked and found to be adequate. No further bleeding was noted. The camera was placed in the epigastric site to view back at the periumbilical site. There was no sign of bowel injury. Therefore, pneumoperitoneum was released. All ports were removed. Fascial edges at the epigastric site were approximated with multiple figure-of-eight 0 Vicryls and 4-0 subcuticular Monocryl was used on all skin edges. Steri-Strips, Mastisol and sterile dressings were placed.   The patient tolerated the procedure well. There were no complications. He was taken to the recovery room in stable condition to be admitted for continued care.     ____________________________ Shawn Alexander E. Excell Seltzerooper, MD rec:dmm D: 04/24/2013 16:27:06 ET T: 04/24/2013 20:56:16 ET JOB#: [id number]  cc: Shawn Alexander E. Excell Seltzerooper, MD, <Dictator> Shawn Alexander E Shawn Brenner MD ELECTRONICALLY SIGNED 04/25/2013 10:46

## 2014-12-26 NOTE — Discharge Summary (Signed)
PATIENT NAME:  Shawn Alexander, Vince MR#:  [id number] DATE OF BIRTH:  04-21-1966  DATE OF ADMISSION:  04/23/2013 DATE OF DISCHARGE:  04/27/2013  DIAGNOSES: 1.  Hepatitis C. 2.  Acute cholecystitis. 3.  Cholelithiasis.   PROCEDURES: Laparoscopic cholecystectomy.   CONSULTANTS: None.   HISTORY OF PRESENT ILLNESS/HOSPITAL COURSE: This is a patient who was admitted to the hospital with signs of acute cholecystitis. He was taken to the operating room where cirrhosis was confirmed and laparoscopic cholecystectomy was performed for acute cholecystitis. He made an uncomplicated postoperative recovery, his drain was removed as it showed no sign of biliary leak, and he is tolerating a regular diet. Discharged in stable condition to resume his regular medications and to continue on Percocet as needed. He will follow up in my office in 10 days. He is instructed to remove his dressings tomorrow or the next day and shower.   ____________________________ Adah Salvageichard E. Excell Seltzerooper, MD rec:dp D: 04/27/2013 12:19:40 ET T: 04/27/2013 14:37:23 ET JOB#: [id number]  cc: Adah Salvageichard E. Excell Seltzerooper, MD, <Dictator> Lattie HawICHARD E COOPER MD ELECTRONICALLY SIGNED 04/27/2013 16:49

## 2014-12-27 NOTE — Discharge Summary (Signed)
PATIENT NAME:  Shawn Alexander, Shawn Alexander MR#:  [id number] DATE OF BIRTH:  April 01, 1966  DATE OF ADMISSION:  05/23/2014 DATE OF DISCHARGE:  05/26/2014   ADMISSION DIAGNOSIS: Neutropenia with fever.   DISCHARGE DIAGNOSES:  1.  Neutropenia with fever on admission, resolved.  Sepsis has been ruled out.  2.  Thrombocytopenia.  3.  History of hepatitis C.  4.  Tobacco dependence.  5.  Anxiety.   CONSULTATIONS:   Janese BanksSandeep Pandit, MD  PERTINENT LABORATORIES AT DISCHARGE:  1.  Renal ultrasound showed normal sized spleen.  2.  Abdominal ultrasound showed no evidence of hepatosplenomegaly.   Discharge white blood cells 2.5, neutrophils 39.4, hemoglobin 14, hematocrit 44, platelets 129,000.   HOSPITAL COURSE: A 49 year old male with a history of hepatitis C apparently in remission. He presented with fever, found to have neutropenia.  For further details, please refer to the history and physical.  1.  Pneumonia with neutropenia with initial fever. The patient was on broad-spectrum antibiotics. His blood cultures are negative to date.  Urine culture was negative. Urinalysis was also negative. His chest x-ray was negative.  There was no source of fever; sepsis has been ruled out. Hepatitis C viral load is still pending to evaluate for neutropenia.  There is no clear cause.  ANC is greater than 800.  The patient has been afebrile since admission.   I did not feel the patient needs any antibiotics at discharge.  There is no clear source of what we are treating.  His neutropenia is improving.  There is a suspicion that it might be related to his hepatitis C.  Hepatitis C viral load is still pending as mentioned above.  The patient will follow up on Friday with Dr. Sherrlyn HockPandit.  At that time, we will results and further evaluation.  2.  Thrombocytopenia improved, after continuing on Lovenox, likely secondary to his hepatitis.  3.  Tobacco abuse. The patient was on a nicotine patch.  4.  Anxiety. The patient will continue on his  Klonopin.   DISCHARGE MEDICATIONS:   1.  Klonopin 1 mg p.o. daily.  2.  Tylenol 325 at 2 tablets every 4 hours p.r.n. pain.   DISCHARGE DIET: Regular diet.   DISCHARGE ACTIVITY:  As tolerated.  FOLLOW-UP:   The patient will follow up on Friday with Dr. Sherrlyn HockPandit. The patient is stable for discharge.   TIME SPENT:  35 minutes.    ____________________________ Janyth ContesSital P. Juliene PinaMody, MD spm:DT D: 05/26/2014 14:49:16 ET T: 05/26/2014 15:44:29 ET JOB#: [id number]  cc: Tracia Lacomb P. Juliene PinaMody, MD, <Dictator> Janyth ContesSITAL P Kala Ambriz MD ELECTRONICALLY SIGNED 05/27/2014 13:07

## 2014-12-27 NOTE — H&P (Signed)
PATIENT NAME:  Shawn Alexander, Shawn Alexander MR#:  [id number] DATE OF BIRTH:  [birth year]  DATE OF ADMISSION:  05/23/2014  PRIMARY CARE PHYSICIAN:  Dr. Lacie Scotts.   CHIEF COMPLAINT: Back pain, fevers.   HISTORY OF PRESENT ILLNESS: A 49 year old male patient with history of hepatitis C, anxiety, presents to the Emergency Room complaining of fevers, back pain. The patient was seen by his primary care physician 5 days back with similar complaints, started on ciprofloxacin after being found to have chronic urinary infection as he mentions. Today he is found to have mild neutropenia with neutrophils of 1100 and fevers of 101.5, tachycardia and is being admitted to the hospitalist service. Although his UA shows no bacteria, no WBCs. Chest x-ray is clear, no infiltrates. No other signs of infection found other than the fever.   He does not use any IV drugs. He has not used any recent antibiotics other than ciprofloxacin prescribed 5 days prior. No sick contacts. No diarrhea. No rash.   PAST MEDICAL HISTORY:  1.  History of anxiety.  2.  Hepatitis C.  3.  Manic depression.  4.  Past cholecystectomy.   SOCIAL HISTORY: The patient moved from [place] a kid. Presently on disability. Smokes a pack a day. No alcohol use. Does not use any marijuana or IV drugs. Lives with his girlfriend.  Has not traveled any place outside [place] recently.   FAMILY HISTORY: Both his parents are healthy.   HOME MEDICATIONS: The patient mentions that he takes Klonopin 3 times a day of unknown dose.   ALLERGIES: No known drug allergies.   REVIEW OF SYSTEMS:   CONSTITUTIONAL:  Has a fever, fatigue.  EYES:  No blurred vision, pain, or redness.   ENT:  No tinnitus, ear pain, hearing loss.   RESPIRATORY: No cough, wheeze, hemoptysis.  CARDIOVASCULAR: No chest pain, orthopnea, edema.   GASTROINTESTINAL: No nausea, vomiting, diarrhea, abdominal pain.   GENITOURINARY:  No dysuria, hematuria, frequency.   ENDOCRINE: No polyuria,  nocturia, thyroid problems.   HEMATOLOGIC AND LYMPHATIC: No anemia, easy bruising, bleeding.  Does have neutropenia.   MUSCULOSKELETAL: Complains of back pain. No joint swelling or redness.  NEUROLOGIC: No focal numbness, weakness.  PSYCHIATRIC: Has anxiety, depression.   PHYSICAL EXAMINATION: VITAL SIGNS: Shows temperature of 101.5, pulse 102, respirations 18, blood pressure 124/85 with saturations  of 97% on room air.  GENERAL: Moderately built, male patient sitting up in bed, without any distress.  PSYCHIATRIC: Alert and oriented x3, pleasant.  HEENT: Atraumatic, normocephalic. Oral mucosa dry and pink. External ears and nose normal. No pallor. No icterus. Pupils bilaterally equal and reactive to light.  NECK: Supple. No thyromegaly or palpable lymph nodes. Trachea midline. No bruits, JVD.  CARDIOVASCULAR: S1, S2 without any murmurs. Peripheral pulses 2+. No edema. Tachycardia RESPIRATORY: Normal work of breathing. Clear to auscultation on both sides. Decreased air entry at the bases.  GASTROINTESTINAL: Soft abdomen, nontender. Bowel sounds present. No organomegaly palpable.  SKIN: Warm and dry.  No petechiae, rash, ulcers.    MUSCULOSKELETAL: No joint tenderness in large joints. Normal muscle tone.  NEUROLOGICAL:  Motor strength is 5/5 in upper and lower extremities. Sensation is intact all over.  LYMPHATIC: No cervical lymphadenopathy.   LABORATORY STUDIES:   1.  Show glucose of 108, BUN 15, creatinine 1.17, with sodium 136, potassium 4.2, chloride 106, bicarbonate 21, calcium 8.4. AST and ALT mildly elevated at 84 and 78, alkaline phosphatase, bilirubin, albumin normal.  2.  WBC 1.7 with neutrophils  of 1100, platelets of 103,000, hemoglobin 14.2.  3.  Urinalysis shows no bacteria, 1 WBC.  4.  Chest x-ray PA and lateral shows no acute disease, no pulmonary edema, no pneumothorax.   ASSESSMENT AND PLAN:  1.  Febrile neutropenia. The patient has mild neutropenia at 1100. The possible  causes being his history of hepatitis C. We will also check hepatitis B, HIV. Consult hematology. At this point will start him broadly on vancomycin and Zosyn, get blood cultures. His urine does not show any bacteria or WBCs but he has been treated with ciprofloxacin as outpatient for 5 days now. Urine culture has already been sent, we will wait for the results. Etiology of the fever is unknown at this point. He does complain of back pain, but does not have any point tenderness. If he continues to have fevers and the etiology is unknown will have to look at his spine for being the possible source.  2.  Sepsis secondary to above. We will put him on IV fluid resuscitation.  3.  Hepatitis C. Presently workup underway for hepatitis A, B, C levels along with HIV, but he has been treated for this in the past.  4.  Tobacco abuse. The patient has been counseled to quit smoking, I counseled him for greater than 3 minutes. We will put him on a nicotine patch.  5.  Anxiety. Will put him on low dose Klonopin that he takes at home.  6.  Deep vein thrombosis prophylaxis with Lovenox. His platelets are at 103,000, I would stop the Lovenox if there is any downtrend on these platelets. He does not have any acute bleed at this point.   CODE STATUS: Full code.   TIME SPENT TODAY ON THIS CASE: 40 minutes.     ____________________________ Molinda BailiffSrikar R. Daeshawn Redmann, MD srs:bu D: 05/23/2014 16:57:29 ET T: 05/23/2014 17:26:26 ET JOB#: [id number]  cc: Wardell HeathSrikar R. Selden Noteboom, MD, <Dictator> Meindert A. Lacie ScottsNiemeyer, MD Orie FishermanSRIKAR R Thomas Rhude MD ELECTRONICALLY SIGNED 05/24/2014 14:35

## 2014-12-27 NOTE — Consult Note (Signed)
HEMATOLOGY followup - overall feels same. No bleeding symptoms. no fevers. No diarrhea or dysuria. sitting in bed, alert and oriented, NAD.          vitals - afebrile, stable          lungs - b/l good air antry          abd - nontender, no splenomegaly.WBC 2100, ANC 800, Hb13.6, platelets 99K. On 9/19 - Cr 1.11. Hb 8.2, WBC 9500, platelets 117K. HCV Ab >11. INR 1.1, PTT 39.4, fibrinogen 178  05/25/14 - Xray abdomen - IMPRESSION: Improving partial small bowel obstruction, though dilated loops of small bowel persist in the upper abdomen demonstrating air-fluid levels. No free intraperitoneal air.   49 year old gentleman with known history of hepatitis C (records are not available, [facility] and per patient, it has been under control and likely in remission), who has been admitted with fevers and low back pain of unclear etiology. Currently, he is on broad-spectrum IV antibiotics. The patient has been noted to have leukopenia/neutropenia and thrombocytopenia since admission. Previous CBCs available on our hospital computer system shows that the WBC count and platelets were normal as recently as June 2015. He does have evidence of cirrhosis on recent CT scan, which could be 1 reason along with known history of hepatitis C. Has evidence of mild coagulaopthy likely from underlying chronic liver disease. ANC better at 800 today, remains afebrile. Remaining workup is pending. Will get ultrasound of the spleen done on Monday to evaluate for splenomegaly. Agree with ongoing supportive care. Will hold off on supportive treatment with G-CSF. If ANC significantly drops to less than 500 and he develops recurrent fevers or acute sickness, will consider G-CSF support. Will continue to follow.     Electronic Signatures: Izola PricePandit, Janzen Sacks Raj (MD)  (Signed on 20-Sep-15 23:45)  Authored  Last Updated: 20-Sep-15 23:45 by Izola PricePandit, Jadin Creque Raj (MD)

## 2014-12-27 NOTE — Consult Note (Signed)
PATIENT NAME:  Shawn Alexander, Shawn Alexander DATE OF BIRTH:  November 21, 1965  DATE OF CONSULTATION:  05/24/2014  REFERRING PHYSICIAN:  Srikar R. Sudini, MD  CONSULTING PHYSICIAN:  Mylin Gignac R. Sherrlyn HockPandit, MD  REASON FOR CONSULTATION: Neutropenia.   HISTORY OF PRESENT ILLNESS: The patient is a 49 year old gentleman with a past medical history significant for history of hepatitis C (per patient and family present, he has [facility] and was told that it is under control or in remission), anxiety, manic depression, cholecystectomy, who has been admitted to the hospital with complaints of back pain and fevers. The patient recently also took ciprofloxacin as an outpatient for a urinary tract infection. Chest x-ray on admission was clear. Urinalysis was negative for bacteria. He is currently started on broad-spectrum antibiotic coverage with vancomycin and Zosyn IV. Denies any diarrhea or bleeding issues. Denies any new cough, sputum or hemoptysis.   CBC on admission showed low WBC count of 1700, hemoglobin 14.2, platelets low at 103,000. Repeat CBC today shows WBC count 1400 with an absolute neutrophil count low at around 550 to 600, platelets 92,000, hemoglobin 13.5. Blood cultures remain negative so far. The patient clinically feels slightly better. Prior CBCs in August 2014 showed WBC 7100 and platelets 238,000. In February 2015 showed WBC 7700, ANC 5700, platelets 221,000. In June 2015, WBC 7400, platelets 219,000, at which time CT scan of the abdomen had reported findings consistent with cirrhosis of the liver, but no splenomegaly.   PAST MEDICAL/SURGICAL HISTORY: As in HPI above.   FAMILY HISTORY: Denies malignancy or hematological disorders.   SOCIAL HISTORY: Chronic smoker 1 pack per day. Denies alcohol usage. Denies recreational drug usage. [place]oved [place] as kid.   ALLERGIES: No known drug allergies.   HOME MEDICATIONS: Klonopin 3 times a day, strength unknown to the  patient.   REVIEW OF SYSTEMS: CONSTITUTIONAL: As in HPI. Recent fevers and chills. Denies night sweats.  HEENT: Denies any headaches or dizziness at rest. No epistaxis, ear or jaw pain. No sinus symptoms.  CARDIAC: No new chest pain, palpitations, orthopnea or PND.  LUNGS: As in HPI.  GASTROINTESTINAL: No nausea, vomiting or diarrhea. No bright red blood in stools or melena.  GENITOURINARY: No dysuria or hematuria at this time.  SKIN: No new rashes or pruritus.  HEMATOLOGIC: Denies bleeding symptoms.  MUSCULOSKELETAL: Complains of low back pain. No other new bone pains.  NEUROLOGIC: No new focal weakness, seizures or loss of consciousness.  ENDOCRINE: No polyuria or polydipsia. Appetite is good, weight is stable.   PHYSICAL EXAMINATION: GENERAL: The patient is sitting in bed, moderately built and nourished individual, alert and oriented and converses appropriately. No icterus or pallor.  VITAL SIGNS: 97.6, 54, 20, 117/73, 98% on room air.  HEENT: Normocephalic, atraumatic. Extraocular movements intact. Sclerae anicteric. No oral thrush or sores.  NECK: Negative for lymphadenopathy.  CARDIOVASCULAR: S1, S2, regular rate and rhythm.  LUNGS: Show bilateral good air entry. No crepitations or rhonchi noted.  ABDOMEN: Soft. No hepatosplenomegaly clinically.  LYMPHATICS: No axillary or inguinal lymphadenopathy.  EXTREMITIES: No major edema or cyanosis.  SKIN: Shows no generalized rashes or major bruising.  NEUROLOGIC: Limited exam. Cranial nerves intact. Moves all extremities spontaneously.   LABORATORY DATA: CBC today shows WBC 1400 with 34% neutrophils, 5%, bands, 45% lymphocytes, 7% monocytes, hemoglobin 13.5, platelets 92,000. Creatinine 1.0, calcium 7.3. Blood culture remains negative so far. Urine cultures negative so far.   DIAGNOSTIC DATA: Liver ultrasound done yesterday showed no  acute abnormality. CT scan in June 2015 had reported findings consistent with cirrhosis of the liver.    IMPRESSION AND RECOMMENDATIONS: A 49 year old gentleman with known history of hepatitis C (records are not available, treated at [facility] and per patient, it has been under control and likely in remission), who has been admitted with fevers and low back pain of unclear etiology. Currently, he is on broad-spectrum IV antibiotics. The patient has been noted to have leukopenia/neutropenia and thrombocytopenia since admission. Previous CBCs available on our hospital computer system shows that the WBC count and platelets were normal as recently as June 2015. He does have evidence of cirrhosis on recent CT scan, which could be 1 reason along with known history of hepatitis C. The patient needs further evaluation to evaluate for other possible etiologies of low blood counts. Accordingly, we will draw labs in the morning (the patient prefers to avoid today), including CBC with manual differential, ferritin, iron and TIBC, B12 and folate level, LDH and haptoglobin, serum protein immunoelectrophoresis, PT/PTT/fibrinogen, quantitative HCV PCR since labs from yesterday shows HCV antibody greater than 11.0. We will get ultrasound of the spleen done on Monday to evaluate for splenomegaly. Agree with ongoing supportive care. ANC today is lower at around 600, but fever is better and the patient is clinically feeling better. We will hold off on supportive treatment with G-CSF. If ANC significantly drops to less than 500 and he develops recurrent fevers or acute sickness, will consider G-CSF support. We will continue to follow.   Thank you for the referral. Please feel free to contact me for additional questions.     ____________________________ Maren Reamer Sherrlyn Hock, MD srp:TT D: 05/24/2014 15:47:17 ET T: 05/24/2014 16:05:49 ET JOB#: [id number]  cc: Satina Jerrell R. Sherrlyn Hock, MD, <Dictator> Wille Celeste MD ELECTRONICALLY SIGNED 05/24/2014 17:10

## 2015-06-27 IMAGING — US US LIVER ELASTOGRAPHY W/O IMAGING
1 series · 13 of 15 positions shown · non-contrast
Comparison: MRI from 06/19/2014

CLINICAL DATA: Hepatitis-C.  Evaluate for cirrhosis.

EXAM:
ULTRASOUND HEPATIC ELASTOGRAPHY
TECHNIQUE: Ultrasound elastography evaluation of the liver was performed. A
region of interest was placed in the right lobe of the liver.
Following application of a compressive sonographic pulse, shear
waves were detected in the adjacent hepatic tissue and the shear
wave velocity was calculated. Multiple assessments were performed at
the selected site. Median shear wave velocity is correlated to a
Metavir fibrosis score.

[Series 1: us liver elastography w/o imaging · 0.15mm/px · 13 of 15 slices shown]
[im 1/15]
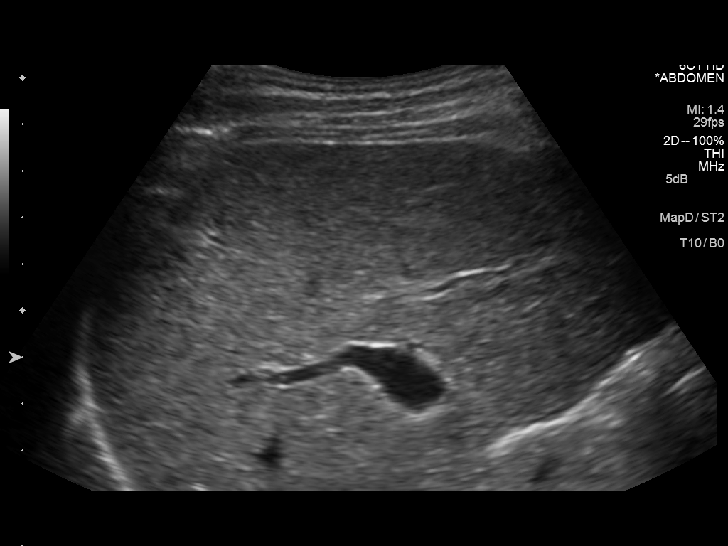
[im 2/15]
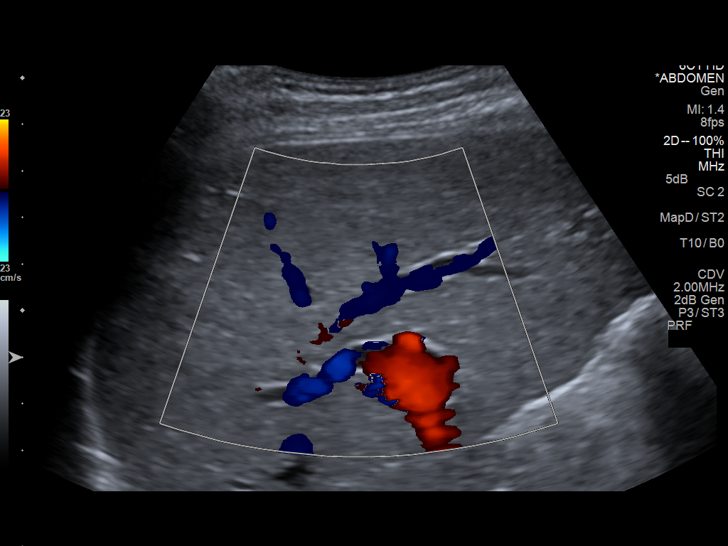
[im 3/15]
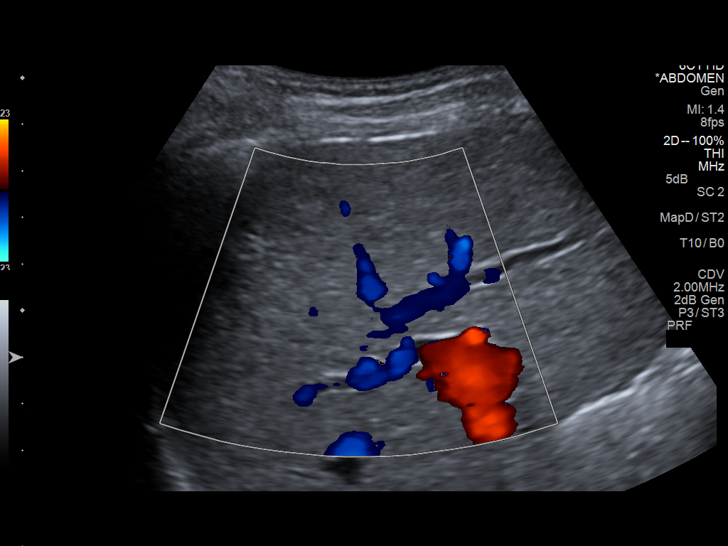
[im 5/15]
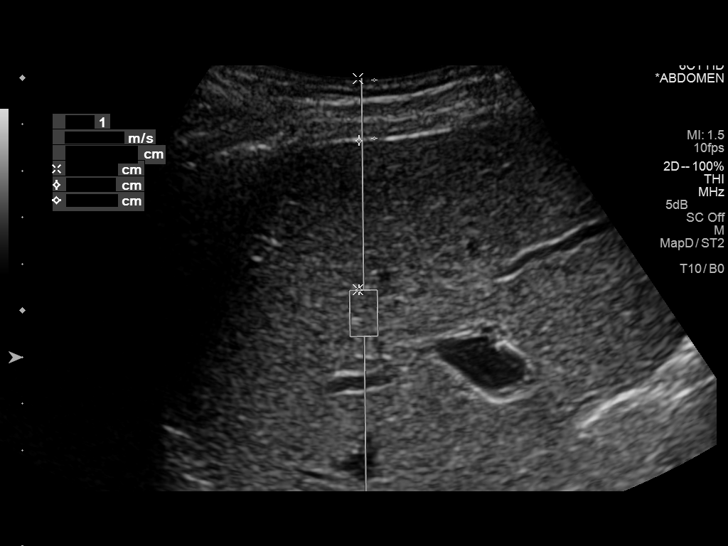
[im 6/15]
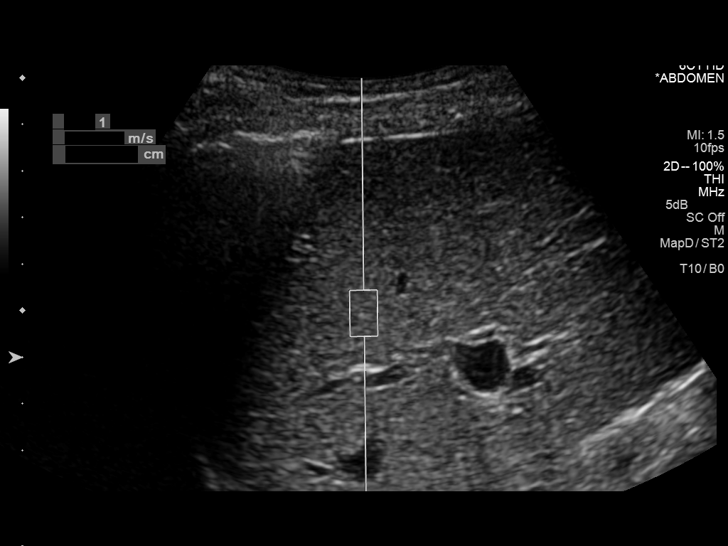
[im 7/15]
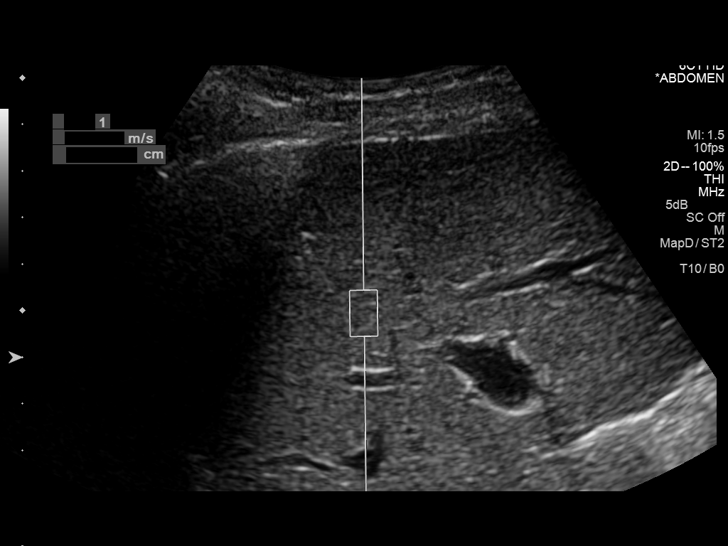
[im 8/15]
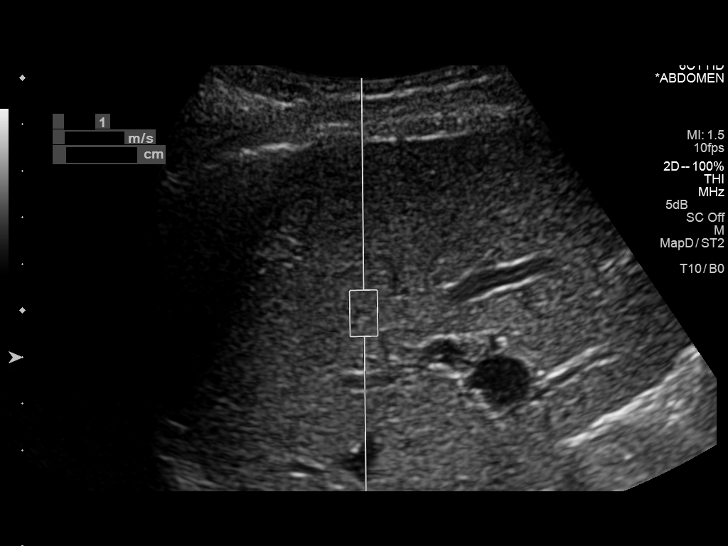
[im 9/15]
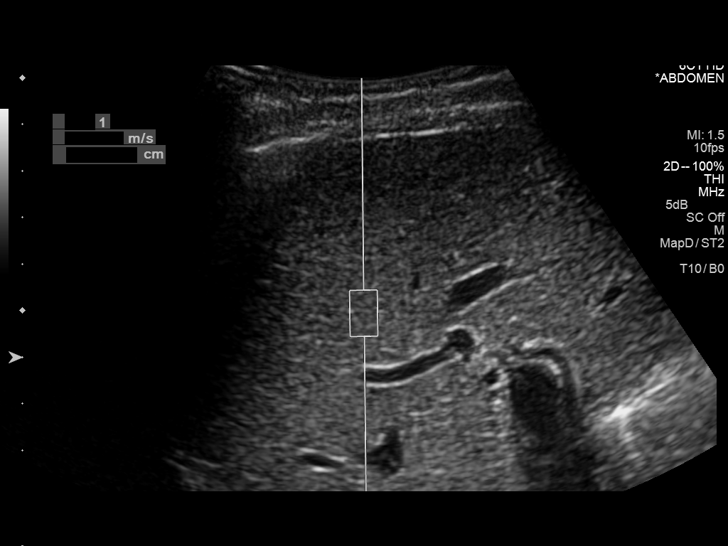
[im 10/15]
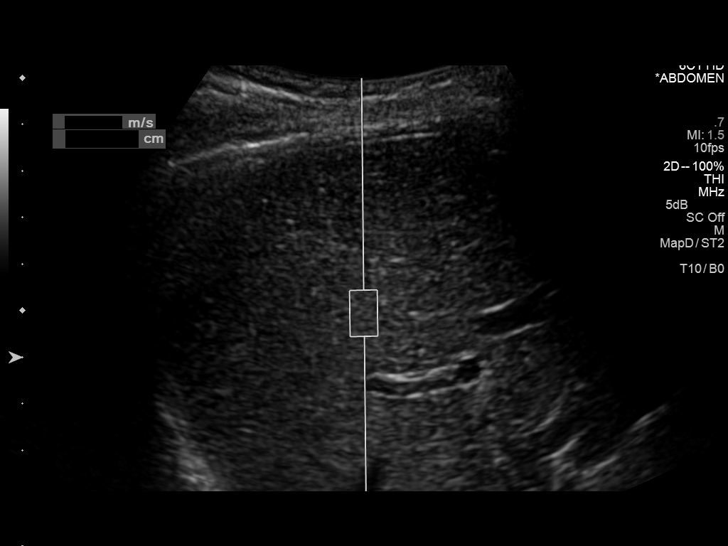
[im 11/15]
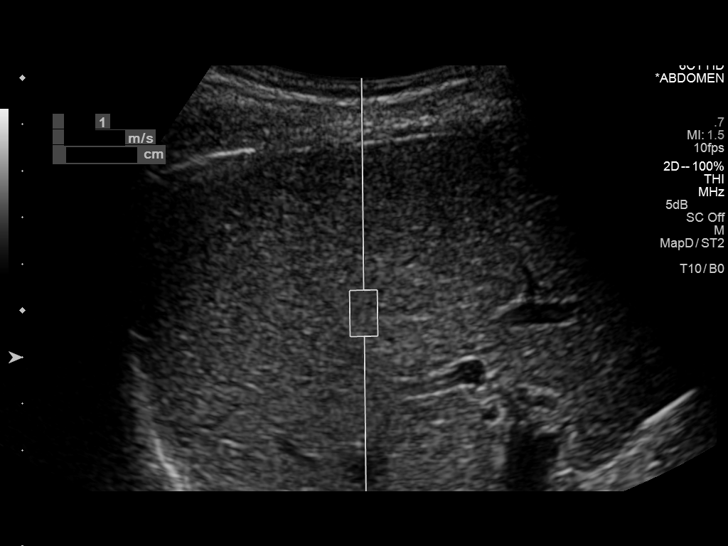
[im 13/15]
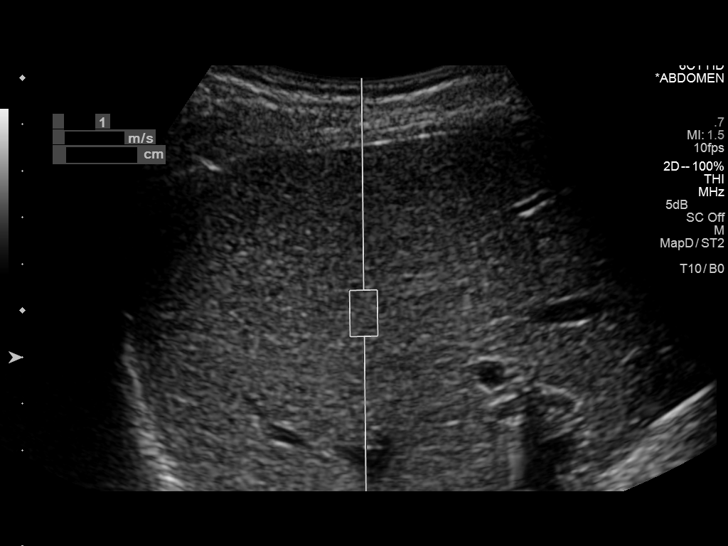
[im 14/15]
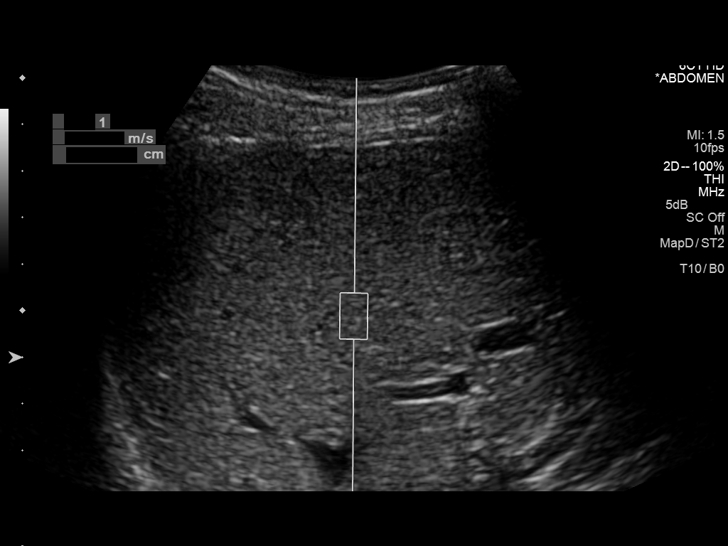
[im 15/15]
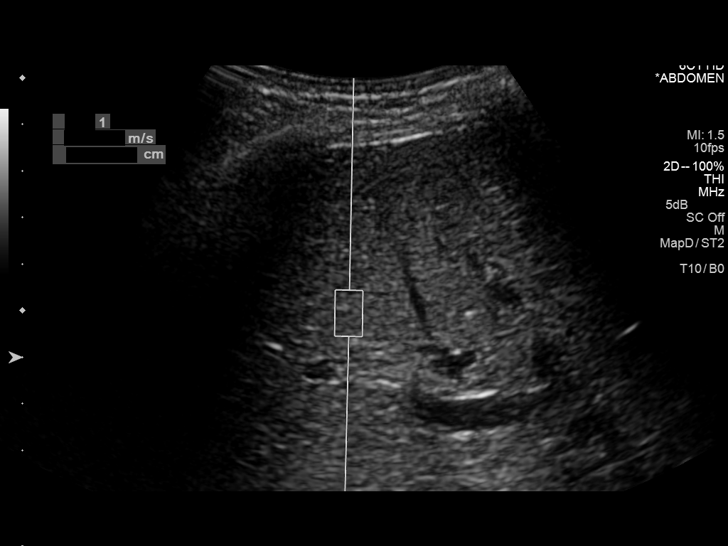

[13 of 15 positions shown; findings below may reference images not displayed]

FINDINGS: Device: Siemens Helix VTQ

Transducer [id number]

Patient position: Supine

Number of measurements: 10

Hepatic segment:  8

Median velocity:   1.89  m/sec

IQR:

IQR/Median velocity ratio:

Corresponding Metavir fibrosis score:  F2/F3

Risk of fibrosis: Moderate

Limitations of exam: None

Pertinent findings noted on other imaging exams:  None

Please note that abnormal shear wave velocities may also be
identified in clinical settings other than with hepatic fibrosis,
such as: acute hepatitis, elevated right heart and central venous
pressures including use of beta blockers, Pomerantz disease
(Rohart), infiltrative processes such as
mastocytosis/amyloidosis/infiltrative tumor, extrahepatic
cholestasis, in the post-prandial state, and liver transplantation.
Correlation with patient history, laboratory data, and clinical
condition recommended.
IMPRESSION: Median hepatic shear wave velocity is calculated at 1.89 m/sec.

Corresponding Metavir fibrosis score is  F 2 and some F 3.

Risk of fibrosis ismoderate.

Follow-up: Additional testing appropriate
